# Patient Record
Sex: Female | Born: 1955 | Race: Black or African American | Hispanic: No | Marital: Single | Smoking: Never smoker
Health system: Southern US, Community
[De-identification: ages and names within clinical notes are randomized; demographics above are authoritative.]

## PROBLEM LIST (undated history)

## (undated) DIAGNOSIS — T783XXA Angioneurotic edema, initial encounter: Secondary | ICD-10-CM

## (undated) HISTORY — DX: Angioneurotic edema, initial encounter: T78.3XXA

---

## 1999-02-20 ENCOUNTER — Ambulatory Visit (HOSPITAL_COMMUNITY): Admission: RE | Admit: 1999-02-20 | Discharge: 1999-02-20 | Payer: Self-pay | Admitting: *Deleted

## 1999-02-20 ENCOUNTER — Encounter: Payer: Self-pay | Admitting: *Deleted

## 2009-01-03 ENCOUNTER — Ambulatory Visit: Payer: Self-pay | Admitting: Diagnostic Radiology

## 2009-01-03 ENCOUNTER — Emergency Department (HOSPITAL_BASED_OUTPATIENT_CLINIC_OR_DEPARTMENT_OTHER): Admission: EM | Admit: 2009-01-03 | Discharge: 2009-01-04 | Payer: Self-pay | Admitting: Emergency Medicine

## 2010-07-09 LAB — COMPREHENSIVE METABOLIC PANEL
ALT: 11 U/L (ref 0–35)
AST: 25 U/L (ref 0–37)
Albumin: 4.4 g/dL (ref 3.5–5.2)
Alkaline Phosphatase: 102 U/L (ref 39–117)
BUN: 7 mg/dL (ref 6–23)
CO2: 32 mEq/L (ref 19–32)
Calcium: 10.1 mg/dL (ref 8.4–10.5)
Chloride: 99 mEq/L (ref 96–112)
Creatinine, Ser: 0.8 mg/dL (ref 0.4–1.2)
GFR calc Af Amer: >60 mL/min (ref >60)
GFR calc non Af Amer: >60 mL/min (ref >60)
Glucose, Bld: 88 mg/dL (ref 70–99)
Potassium: 3.8 mEq/L (ref 3.5–5.1)
Sodium: 141 mEq/L (ref 135–145)
Total Bilirubin: 0.2 mg/dL — ABNORMAL LOW (ref 0.3–1.2)
Total Protein: 8.7 g/dL — ABNORMAL HIGH (ref 6.0–8.3)

## 2010-07-09 LAB — DIFFERENTIAL
Basophils Relative: 1 % (ref 0–1)
Eosinophils Absolute: 0.1 10*3/uL (ref 0.0–0.7)
Eosinophils Relative: 1 % (ref 0–5)
Lymphs Abs: 1.6 10*3/uL (ref 0.7–4.0)
Monocytes Absolute: 0.3 10*3/uL (ref 0.1–1.0)
Monocytes Relative: 5 % (ref 3–12)
Neutrophils Relative %: 67 % (ref 43–77)

## 2010-07-09 LAB — POCT CARDIAC MARKERS
CKMB, poc: <1 ng/mL — ABNORMAL LOW (ref 1.0–8.0)
Myoglobin, poc: 47.2 ng/mL (ref 12–200)
Myoglobin, poc: 93.4 ng/mL (ref 12–200)

## 2010-07-09 LAB — CBC
HCT: 44.1 % (ref 36.0–46.0)
Hemoglobin: 14.6 g/dL (ref 12.0–15.0)
MCHC: 33.1 g/dL (ref 30.0–36.0)
MCV: 80.4 fL (ref 78.0–100.0)
Platelets: 245 10*3/uL (ref 150–400)
RBC: 5.49 MIL/uL — ABNORMAL HIGH (ref 3.87–5.11)
RDW: 15 % (ref 11.5–15.5)
WBC: 6 10*3/uL (ref 4.0–10.5)

## 2010-07-09 LAB — URINALYSIS, ROUTINE W REFLEX MICROSCOPIC
Bilirubin Urine: NEGATIVE
Glucose, UA: NEGATIVE mg/dL
Ketones, ur: NEGATIVE mg/dL
Nitrite: NEGATIVE
Specific Gravity, Urine: 1.002 — ABNORMAL LOW (ref 1.005–1.030)
pH: 5.5 (ref 5.0–8.0)

## 2010-07-09 LAB — LACTIC ACID, PLASMA: Lactic Acid, Venous: 1.2 mmol/L (ref 0.5–2.2)

## 2010-07-09 LAB — URINE MICROSCOPIC-ADD ON

## 2010-07-09 LAB — PREGNANCY, URINE: Preg Test, Ur: NEGATIVE

## 2010-07-24 IMAGING — CT CT ABDOMEN W/ CM
2 of 5 series · 16 of 46 positions shown, 18 images · IV contrast (APPLIED)
Comparison: None

CT ABDOMEN

CLINICAL DATA: Abdominal and pelvic pain.  Cramping.  Recent
colonoscopy and endoscopy yesterday.

CT ABDOMEN AND PELVIS WITH CONTRAST
TECHNIQUE: Multidetector CT imaging of the abdomen and pelvis was
performed using the standard protocol following bolus
administration of intravenous contrast.
Contrast: 100 ml Qmnipaque-2NN and oral contrast

[Series 2: abd/pelvis 5.0 b31f · axial · 0.66mm/px · z∈[-432,-32]mm · 13 of 92 slices shown, 15 images]
[im 6/92  soft-tissue]
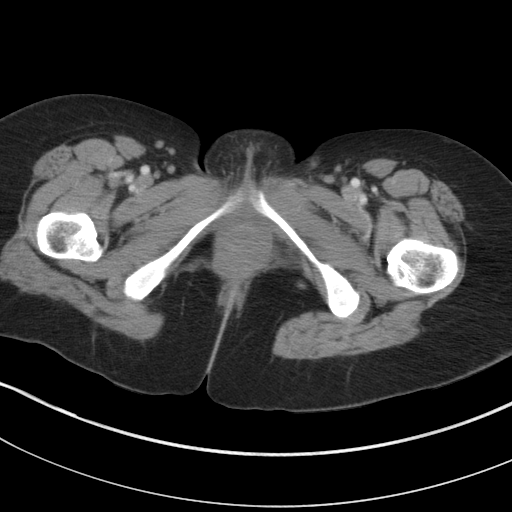
[im 6/92  bone]
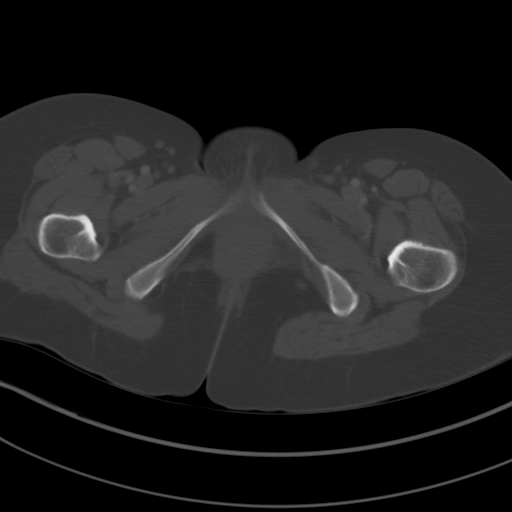
[im 11/92  soft-tissue]
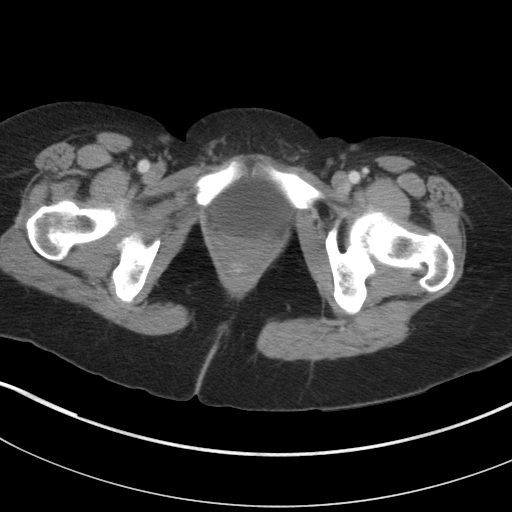
[im 21/92  soft-tissue]
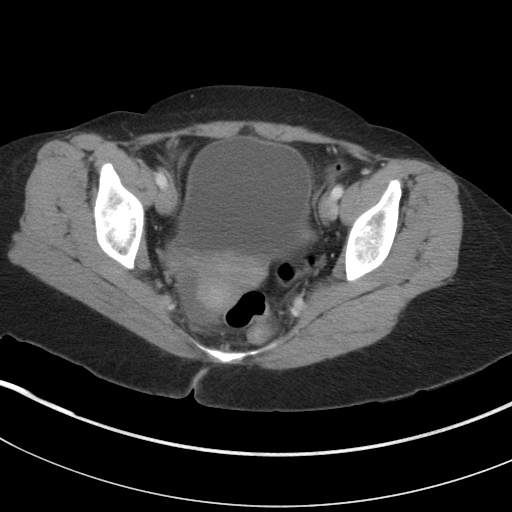
[im 26/92  soft-tissue]
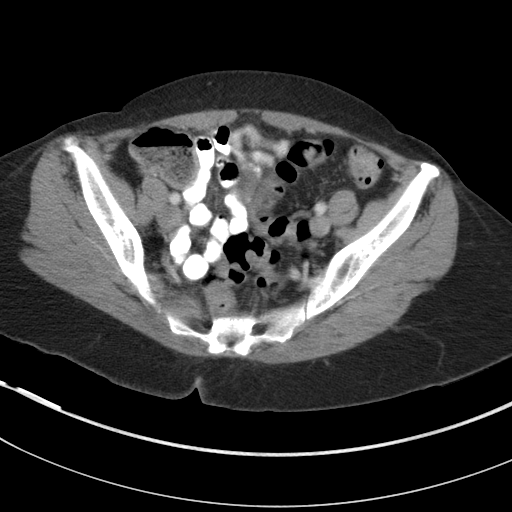
[im 31/92  soft-tissue]
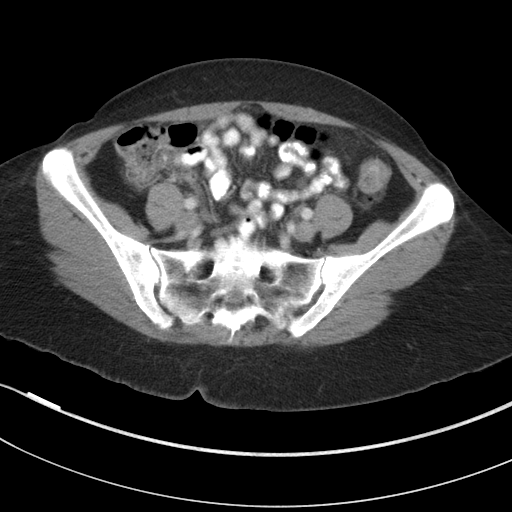
[im 41/92  soft-tissue]
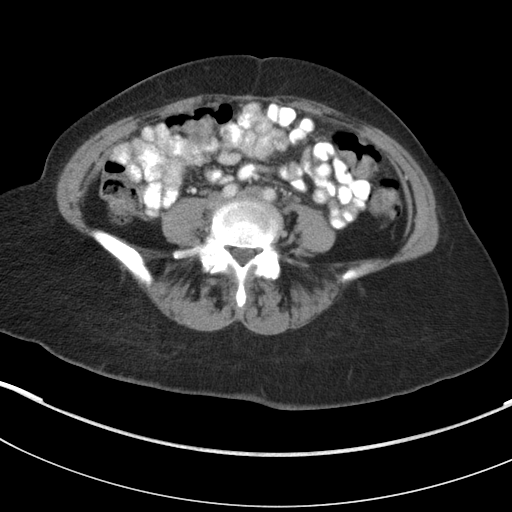
[im 46/92  soft-tissue]
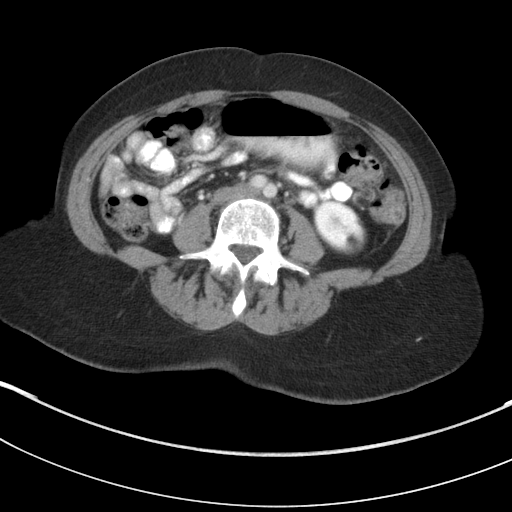
[im 51/92  soft-tissue]
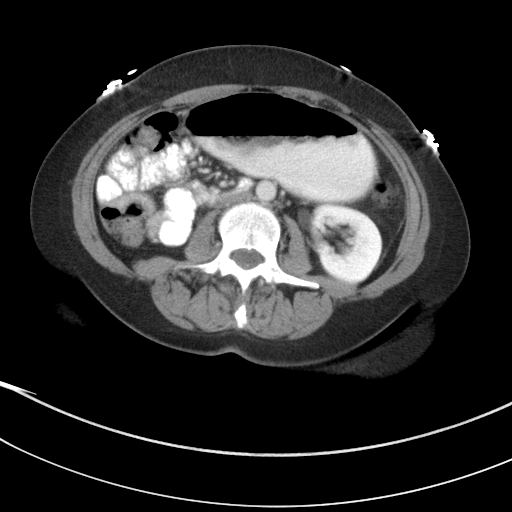
[im 61/92  soft-tissue]
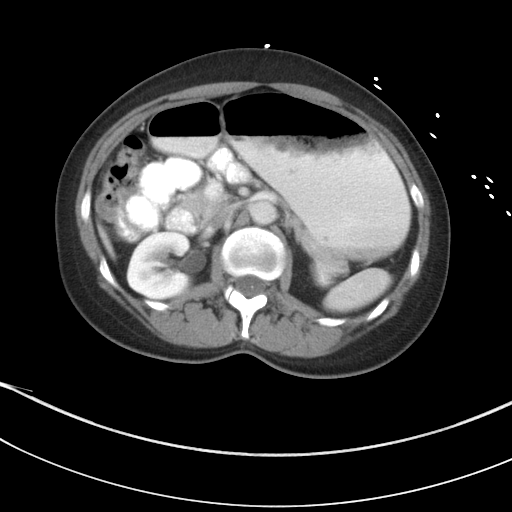
[im 61/92  bone]
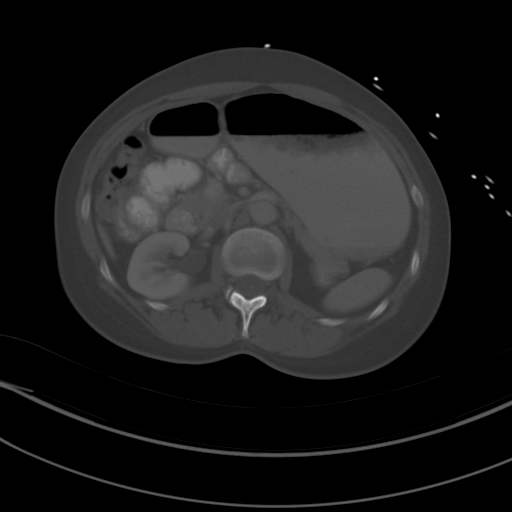
[im 66/92  soft-tissue]
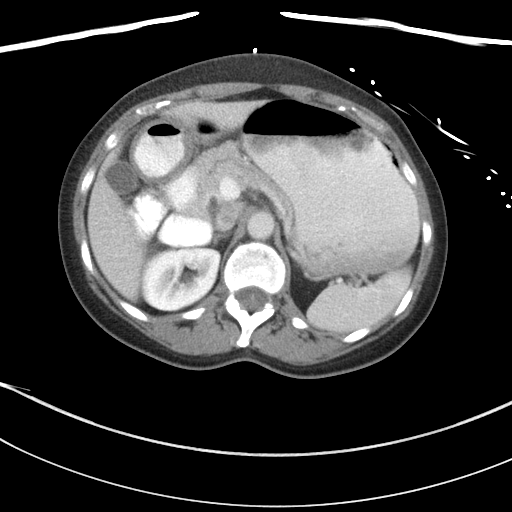
[im 71/92  soft-tissue]
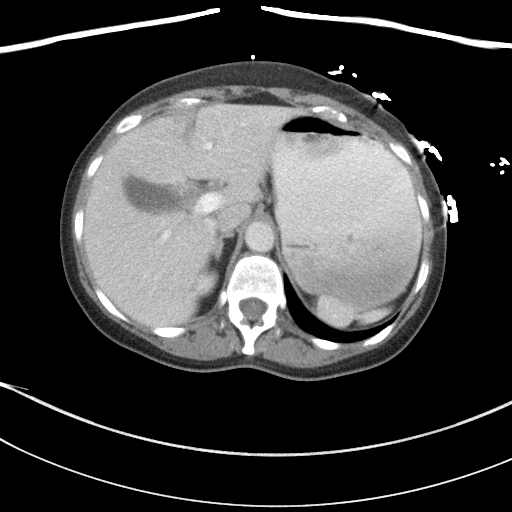
[im 81/92  soft-tissue]
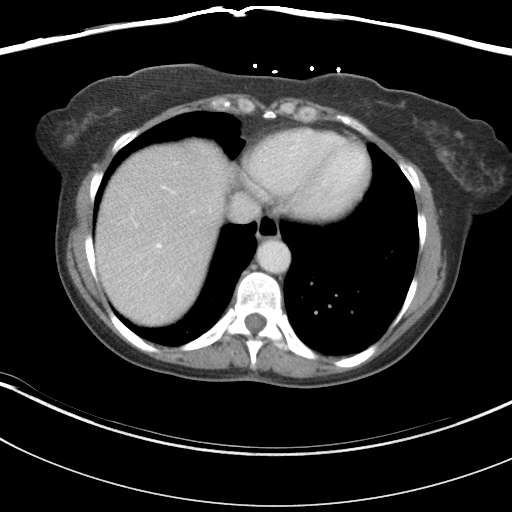
[im 86/92  soft-tissue]
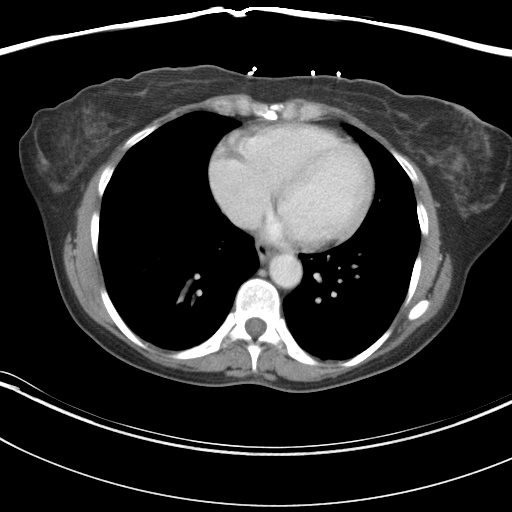

[Series 5: abd/pelvis 3.0 coronal · coronal · 0.74mm/px · 3 of 76 slices shown]
[im 26/76  soft-tissue]
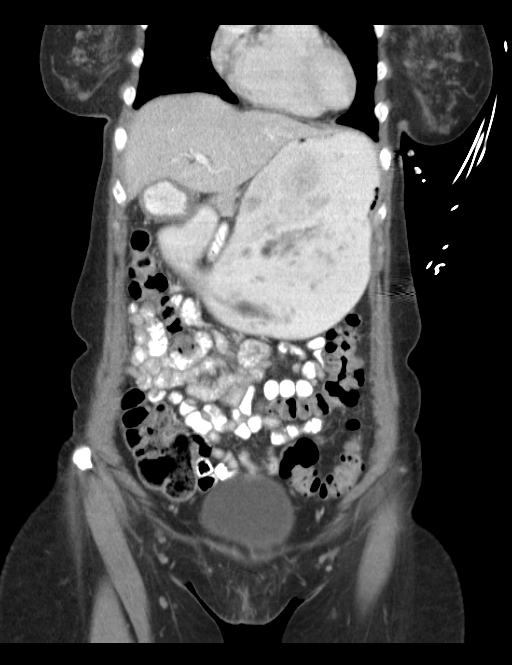
[im 34/76  soft-tissue]
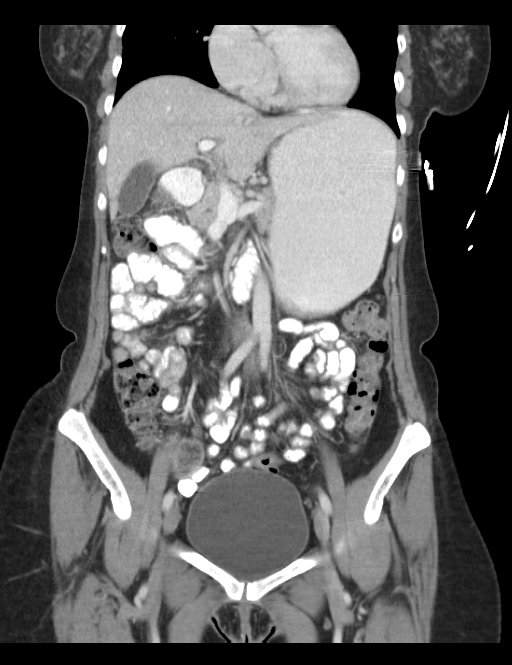
[im 42/76  soft-tissue]
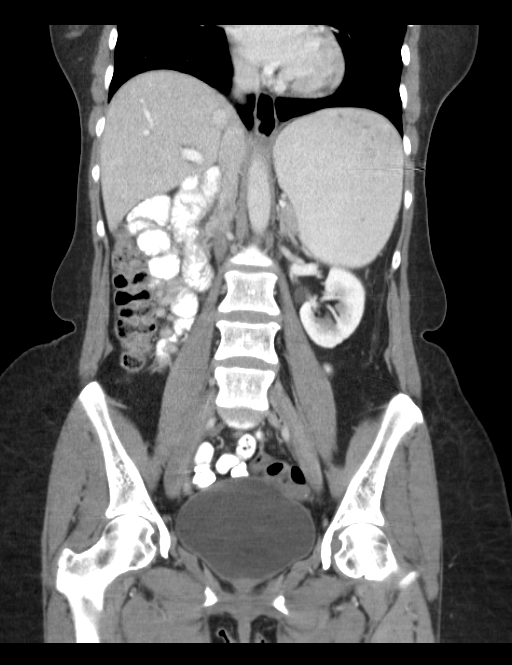

[16 of 46 positions shown; findings below may reference images not displayed]

FINDINGS: The abdominal parenchymal organs are normal in
appearance.  Gallbladder is unremarkable, and there is no evidence
of hydronephrosis.  There is no evidence of mass or adenopathy.
There is no evidence of inflammatory process or abnormal fluid
collections.

Congenital malrotation of bowel is seen, with proximal jejunum in
the right upper quadrant of the the abdomen.  Cecum appears normal
position in the right lower quadrant.  There is no evidence of
dilated bowel loops of bowel wall thickening.
IMPRESSION: 1.  No acute intra-abdominal findings.
2.  Congenital malrotation of bowel incidentally noted.

CT PELVIS
FINDINGS: A small amount of free fluid is seen in the pelvic cul-
de-sac.  This is nonspecific finding and may be physiologic in a
menstrual age female.  There is no evidence of inflammatory process
or abscess.  There is no evidence of pelvic soft tissue masses or
lymphadenopathy.  There is no evidence of dilated bowel loops.
IMPRESSION: Small amount of free fluid in the pelvis, which is nonspecific but
may be physiologic in a menstrual age female.  Otherwise
unremarkable pelvis CT.

## 2014-01-01 DIAGNOSIS — R202 Paresthesia of skin: Secondary | ICD-10-CM | POA: Insufficient documentation

## 2014-01-01 DIAGNOSIS — W868XXA Exposure to other electric current, initial encounter: Secondary | ICD-10-CM | POA: Insufficient documentation

## 2014-01-01 DIAGNOSIS — G43909 Migraine, unspecified, not intractable, without status migrainosus: Secondary | ICD-10-CM | POA: Insufficient documentation

## 2014-01-01 DIAGNOSIS — R142 Eructation: Secondary | ICD-10-CM | POA: Insufficient documentation

## 2014-01-01 DIAGNOSIS — Z9889 Other specified postprocedural states: Secondary | ICD-10-CM | POA: Insufficient documentation

## 2014-01-01 DIAGNOSIS — M8580 Other specified disorders of bone density and structure, unspecified site: Secondary | ICD-10-CM | POA: Insufficient documentation

## 2014-01-01 DIAGNOSIS — N63 Unspecified lump in unspecified breast: Secondary | ICD-10-CM | POA: Insufficient documentation

## 2016-05-07 DIAGNOSIS — H04123 Dry eye syndrome of bilateral lacrimal glands: Secondary | ICD-10-CM | POA: Diagnosis not present

## 2016-05-07 DIAGNOSIS — H40023 Open angle with borderline findings, high risk, bilateral: Secondary | ICD-10-CM | POA: Diagnosis not present

## 2016-06-03 DIAGNOSIS — R197 Diarrhea, unspecified: Secondary | ICD-10-CM | POA: Diagnosis not present

## 2016-06-09 DIAGNOSIS — R197 Diarrhea, unspecified: Secondary | ICD-10-CM | POA: Diagnosis not present

## 2016-06-09 DIAGNOSIS — R109 Unspecified abdominal pain: Secondary | ICD-10-CM | POA: Diagnosis not present

## 2016-06-10 DIAGNOSIS — R197 Diarrhea, unspecified: Secondary | ICD-10-CM | POA: Diagnosis not present

## 2016-07-06 DIAGNOSIS — H5213 Myopia, bilateral: Secondary | ICD-10-CM | POA: Diagnosis not present

## 2016-07-06 DIAGNOSIS — H40023 Open angle with borderline findings, high risk, bilateral: Secondary | ICD-10-CM | POA: Diagnosis not present

## 2016-07-06 DIAGNOSIS — H04123 Dry eye syndrome of bilateral lacrimal glands: Secondary | ICD-10-CM | POA: Diagnosis not present

## 2016-09-23 DIAGNOSIS — R05 Cough: Secondary | ICD-10-CM | POA: Diagnosis not present

## 2016-10-30 DIAGNOSIS — M25512 Pain in left shoulder: Secondary | ICD-10-CM | POA: Diagnosis not present

## 2016-10-30 DIAGNOSIS — M62838 Other muscle spasm: Secondary | ICD-10-CM | POA: Diagnosis not present

## 2016-12-07 DIAGNOSIS — Z1231 Encounter for screening mammogram for malignant neoplasm of breast: Secondary | ICD-10-CM | POA: Diagnosis not present

## 2016-12-10 DIAGNOSIS — R928 Other abnormal and inconclusive findings on diagnostic imaging of breast: Secondary | ICD-10-CM | POA: Diagnosis not present

## 2016-12-10 DIAGNOSIS — N6002 Solitary cyst of left breast: Secondary | ICD-10-CM | POA: Diagnosis not present

## 2016-12-10 DIAGNOSIS — N6320 Unspecified lump in the left breast, unspecified quadrant: Secondary | ICD-10-CM | POA: Diagnosis not present

## 2016-12-13 DIAGNOSIS — M8588 Other specified disorders of bone density and structure, other site: Secondary | ICD-10-CM | POA: Diagnosis not present

## 2017-01-03 DIAGNOSIS — Z23 Encounter for immunization: Secondary | ICD-10-CM | POA: Diagnosis not present

## 2017-01-03 DIAGNOSIS — Z0000 Encounter for general adult medical examination without abnormal findings: Secondary | ICD-10-CM | POA: Diagnosis not present

## 2017-01-03 DIAGNOSIS — Z136 Encounter for screening for cardiovascular disorders: Secondary | ICD-10-CM | POA: Diagnosis not present

## 2017-01-04 DIAGNOSIS — M1711 Unilateral primary osteoarthritis, right knee: Secondary | ICD-10-CM | POA: Diagnosis not present

## 2017-01-04 DIAGNOSIS — M170 Bilateral primary osteoarthritis of knee: Secondary | ICD-10-CM | POA: Diagnosis not present

## 2017-01-04 DIAGNOSIS — M1712 Unilateral primary osteoarthritis, left knee: Secondary | ICD-10-CM | POA: Diagnosis not present

## 2017-02-28 DIAGNOSIS — Z01419 Encounter for gynecological examination (general) (routine) without abnormal findings: Secondary | ICD-10-CM | POA: Diagnosis not present

## 2017-04-05 HISTORY — PX: BREAST LUMPECTOMY: SHX2

## 2017-05-06 DIAGNOSIS — H04123 Dry eye syndrome of bilateral lacrimal glands: Secondary | ICD-10-CM | POA: Diagnosis not present

## 2017-05-06 DIAGNOSIS — H01009 Unspecified blepharitis unspecified eye, unspecified eyelid: Secondary | ICD-10-CM | POA: Diagnosis not present

## 2017-06-14 DIAGNOSIS — Z09 Encounter for follow-up examination after completed treatment for conditions other than malignant neoplasm: Secondary | ICD-10-CM | POA: Diagnosis not present

## 2017-06-14 DIAGNOSIS — N6002 Solitary cyst of left breast: Secondary | ICD-10-CM | POA: Diagnosis not present

## 2017-07-04 DIAGNOSIS — S9031XA Contusion of right foot, initial encounter: Secondary | ICD-10-CM | POA: Diagnosis not present

## 2017-07-12 DIAGNOSIS — H04123 Dry eye syndrome of bilateral lacrimal glands: Secondary | ICD-10-CM | POA: Diagnosis not present

## 2017-07-12 DIAGNOSIS — H40023 Open angle with borderline findings, high risk, bilateral: Secondary | ICD-10-CM | POA: Diagnosis not present

## 2017-07-12 DIAGNOSIS — H01009 Unspecified blepharitis unspecified eye, unspecified eyelid: Secondary | ICD-10-CM | POA: Diagnosis not present

## 2017-11-16 DIAGNOSIS — M170 Bilateral primary osteoarthritis of knee: Secondary | ICD-10-CM | POA: Diagnosis not present

## 2017-11-16 DIAGNOSIS — M25562 Pain in left knee: Secondary | ICD-10-CM | POA: Diagnosis not present

## 2017-11-16 DIAGNOSIS — M25561 Pain in right knee: Secondary | ICD-10-CM | POA: Diagnosis not present

## 2017-12-13 DIAGNOSIS — Z09 Encounter for follow-up examination after completed treatment for conditions other than malignant neoplasm: Secondary | ICD-10-CM | POA: Diagnosis not present

## 2017-12-13 DIAGNOSIS — R922 Inconclusive mammogram: Secondary | ICD-10-CM | POA: Diagnosis not present

## 2017-12-13 DIAGNOSIS — N6002 Solitary cyst of left breast: Secondary | ICD-10-CM | POA: Diagnosis not present

## 2017-12-15 ENCOUNTER — Other Ambulatory Visit: Payer: Self-pay | Admitting: Obstetrics and Gynecology

## 2017-12-15 DIAGNOSIS — N6489 Other specified disorders of breast: Secondary | ICD-10-CM

## 2017-12-20 ENCOUNTER — Ambulatory Visit
Admission: RE | Admit: 2017-12-20 | Discharge: 2017-12-20 | Disposition: A | Discharge status: Home / Self Care | Payer: 59 | Source: Ambulatory Visit | Attending: Obstetrics and Gynecology | Admitting: Obstetrics and Gynecology

## 2017-12-20 DIAGNOSIS — N6489 Other specified disorders of breast: Secondary | ICD-10-CM

## 2017-12-20 DIAGNOSIS — N6092 Unspecified benign mammary dysplasia of left breast: Secondary | ICD-10-CM | POA: Diagnosis not present

## 2017-12-20 DIAGNOSIS — N6321 Unspecified lump in the left breast, upper outer quadrant: Secondary | ICD-10-CM | POA: Diagnosis not present

## 2018-01-05 DIAGNOSIS — Z23 Encounter for immunization: Secondary | ICD-10-CM | POA: Diagnosis not present

## 2018-01-11 DIAGNOSIS — R928 Other abnormal and inconclusive findings on diagnostic imaging of breast: Secondary | ICD-10-CM | POA: Diagnosis not present

## 2018-01-17 DIAGNOSIS — G43909 Migraine, unspecified, not intractable, without status migrainosus: Secondary | ICD-10-CM | POA: Diagnosis not present

## 2018-01-17 DIAGNOSIS — E663 Overweight: Secondary | ICD-10-CM | POA: Diagnosis not present

## 2018-01-17 DIAGNOSIS — Z23 Encounter for immunization: Secondary | ICD-10-CM | POA: Diagnosis not present

## 2018-01-17 DIAGNOSIS — Z13220 Encounter for screening for lipoid disorders: Secondary | ICD-10-CM | POA: Diagnosis not present

## 2018-01-17 DIAGNOSIS — Z0000 Encounter for general adult medical examination without abnormal findings: Secondary | ICD-10-CM | POA: Diagnosis not present

## 2018-01-24 DIAGNOSIS — R928 Other abnormal and inconclusive findings on diagnostic imaging of breast: Secondary | ICD-10-CM | POA: Diagnosis not present

## 2018-02-02 DIAGNOSIS — R928 Other abnormal and inconclusive findings on diagnostic imaging of breast: Secondary | ICD-10-CM | POA: Diagnosis not present

## 2018-02-02 DIAGNOSIS — N6012 Diffuse cystic mastopathy of left breast: Secondary | ICD-10-CM | POA: Diagnosis not present

## 2018-02-02 DIAGNOSIS — N62 Hypertrophy of breast: Secondary | ICD-10-CM | POA: Diagnosis not present

## 2018-02-02 DIAGNOSIS — N6489 Other specified disorders of breast: Secondary | ICD-10-CM | POA: Diagnosis not present

## 2018-03-19 DIAGNOSIS — N61 Mastitis without abscess: Secondary | ICD-10-CM | POA: Insufficient documentation

## 2018-03-20 DIAGNOSIS — Z01419 Encounter for gynecological examination (general) (routine) without abnormal findings: Secondary | ICD-10-CM | POA: Diagnosis not present

## 2018-06-23 DIAGNOSIS — T783XXA Angioneurotic edema, initial encounter: Secondary | ICD-10-CM | POA: Diagnosis not present

## 2018-07-10 DIAGNOSIS — R21 Rash and other nonspecific skin eruption: Secondary | ICD-10-CM | POA: Diagnosis not present

## 2018-07-12 ENCOUNTER — Ambulatory Visit: Payer: Self-pay | Admitting: Allergy

## 2018-07-18 ENCOUNTER — Encounter: Payer: Self-pay | Admitting: Allergy

## 2018-07-18 ENCOUNTER — Other Ambulatory Visit: Payer: Self-pay

## 2018-07-18 ENCOUNTER — Ambulatory Visit (INDEPENDENT_AMBULATORY_CARE_PROVIDER_SITE_OTHER): Payer: 59 | Admitting: Allergy

## 2018-07-18 VITALS — BP 100/70 | HR 82 | Temp 97.8°F | Resp 16 | Ht 62.21 in | Wt 148.0 lb

## 2018-07-18 DIAGNOSIS — J3089 Other allergic rhinitis: Secondary | ICD-10-CM | POA: Diagnosis not present

## 2018-07-18 DIAGNOSIS — T783XXA Angioneurotic edema, initial encounter: Secondary | ICD-10-CM | POA: Insufficient documentation

## 2018-07-18 DIAGNOSIS — Z91030 Bee allergy status: Secondary | ICD-10-CM | POA: Diagnosis not present

## 2018-07-18 DIAGNOSIS — T783XXD Angioneurotic edema, subsequent encounter: Secondary | ICD-10-CM

## 2018-07-18 NOTE — Patient Instructions (Addendum)
Today's skin testing showed: positive to dust mites and cockroaches.  This does not explain your symptoms. Get bloodwork - CBC diff, CMP, ESR, CRP, Thyroid cascade, ANA w/reflex, C3, C4, SPEP and UPEP, tangerine IgE  . Avoid the following potential triggers: alcohol, tight clothing, NSAIDs.    For mild symptoms you can take over the counter antihistamines such as Benadryl and monitor symptoms closely. If symptoms worsen or if you have severe symptoms including breathing issues, throat closure, significant swelling, whole body hives, severe diarrhea and vomiting, lightheadedness then inject epinephrine and seek immediate medical care afterwards.   Take pictures if this happens again.    May use over the counter antihistamines such as Zyrtec (cetirizine), Claritin (loratadine), Allegra (fexofenadine), or Xyzal (levocetirizine) daily as needed.  Follow up in 1 month  Control of House Dust Mite Allergen . Dust mite allergens are a common trigger of allergy and asthma symptoms. While they can be found throughout the house, these microscopic creatures thrive in warm, humid environments such as bedding, upholstered furniture and carpeting. . Because so much time is spent in the bedroom, it is essential to reduce mite levels there.  . Encase pillows, mattresses, and box springs in special allergen-proof fabric covers or airtight, zippered plastic covers.  . Bedding should be washed weekly in hot water (130 F) and dried in a hot dryer. Allergen-proof covers are available for comforters and pillows that can't be regularly washed.  Wendee Copp the allergy-proof covers every few months. Minimize clutter in the bedroom. Keep pets out of the bedroom.  Marland Kitchen Keep humidity less than 50% by using a dehumidifier or air conditioning. You can buy a humidity measuring device called a hygrometer to monitor this.  . If possible, replace carpets with hardwood, linoleum, or washable area rugs. If that's not possible, vacuum  frequently with a vacuum that has a HEPA filter. . Remove all upholstered furniture and non-washable window drapes from the bedroom. . Remove all non-washable stuffed toys from the bedroom.  Wash stuffed toys weekly. Cockroach Allergen Avoidance Cockroaches are often found in the homes of densely populated urban areas, schools or commercial buildings, but these creatures can lurk almost anywhere. This does not mean that you have a dirty house or living area. . Block all areas where roaches can enter the home. This includes crevices, wall cracks and windows.  . Cockroaches need water to survive, so fix and seal all leaky faucets and pipes. Have an exterminator go through the house when your family and pets are gone to eliminate any remaining roaches. Marland Kitchen Keep food in lidded containers and put pet food dishes away after your pets are done eating. Vacuum and sweep the floor after meals, and take out garbage and recyclables. Use lidded garbage containers in the kitchen. Wash dishes immediately after use and clean under stoves, refrigerators or toasters where crumbs can accumulate. Wipe off the stove and other kitchen surfaces and cupboards regularly.

## 2018-07-18 NOTE — Assessment & Plan Note (Signed)
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 Takes OTC antihistamines with good benefit.  Today's skin testing was only positive to dust mites and cockroaches. Discussed environmental control measures.  May use over the counter antihistamines such as Zyrtec (cetirizine), Claritin (loratadine), Allegra (fexofenadine), or Xyzal (levocetirizine) daily as needed.

## 2018-07-18 NOTE — Progress Notes (Signed)
New Patient Note  RE: Pamela Swanson MRN: [id number] DOB: 20-Jan-1956 Date of Office Visit: 07/18/2018  Referring provider: Renford Dills, MD Primary care provider: Renford Dills, MD  Chief Complaint: Angioedema  History of Present Illness: I had the pleasure of seeing Pamela Swanson for initial evaluation at the [facility] on 07/18/2018. She is a 63 y.o. female, who is referred here by Renford Dills, MD for the evaluation of angioedema.    About 1 month ago patient went to bed around 10PM and about 30 minutes afterwards she noticed left sided cheek swelling. Denies any other symptoms or respiratory issues. She called her on call PCP who advised her to take some benadryl and symptoms resolved within a few hours.   She had dinner around 4-5PM and she is not sure what exactly she had but believes she had a veggie burger which she had on multiple occasions with no issues. There was also concern about tangerine as she usually does not eat citrus fruits as they trigger her migraines but has been eating tangerines more often lately.    1 week later the same thing happened around 10PM but this time her tongue swelled up and it was so enlarged she had difficulty speaking. She called her on call PCP again and was told to take some benadryl which helped but this time it took about 2 days for it to completely resolve. Denies any other associated symptoms. She is not sure what she ate this day.   Denies any pruritus but maybe she had little bumps on her arm.  She went to see the PCP the following day and she was referred to [place].   Patient has been a strict vegetarian for 30 years. No previous angioedema episodes. Denies any fevers, chills, changes in medications, personal care products or recent infections.  Previous work up includes: none. Patient is up to date with the following cancer screening tests: mammogram, colonoscopy, pap smears. No history of cancer.  Assessment  and Plan: [name] is a 63 y.o. female with: Angio-edema 2 episodes of angioedema on involving cheek and another the tongue 1 week apart. No triggers noted. No other symptoms. Denies changes in medications, personal care products, recent infections. She has been eating more tangerines which is new. Took benadryl and symptoms resolved within 1-2 days. No prior history of angioedema.  Today's skin testing showed: positive to dust mites and cockroaches. Negative to select foods.  Discussed with patient that this does not explain her symptoms and at this point not sure what triggered these angioedema episodes.   Avoid citrus fruits for now.   Will get some bloodwork as below to rule out any other etiologies.  . Avoid the following potential triggers: alcohol, tight clothing, NSAIDs.   Take pictures if this happens again.   For mild symptoms you can take over the counter antihistamines such as Benadryl and monitor symptoms closely. If symptoms worsen or if you have severe symptoms including breathing issues, throat closure, significant swelling, whole body hives, severe diarrhea and vomiting, lightheadedness then inject epinephrine and seek immediate medical care afterwards.  Anaphylaxis action plan given.   Other allergic rhinitis Rhino conjunctivitis symptoms for the past 50+ years mainly in the spring. Takes OTC antihistamines with good benefit.  Today's skin testing was only positive to dust mites and cockroaches. Discussed environmental control measures.  May use over the counter antihistamines such as Zyrtec (cetirizine), Claritin (loratadine), Allegra (fexofenadine), or Xyzal (levocetirizine) daily as  needed.  H/O bee sting allergy History of reaction as a child and has Epipen due to this. No previous work up.  Get tryptase and hymenoptera panel.  Continue to avoid.  For mild symptoms you can take over the counter antihistamines such as Benadryl and monitor symptoms closely. If  symptoms worsen or if you have severe symptoms including breathing issues, throat closure, significant swelling, whole body hives, severe diarrhea and vomiting, lightheadedness then inject epinephrine and seek immediate medical care afterwards.  Return in about 4 weeks (around 08/15/2018).  Lab Orders     CBC with Differential/Platelet     Comprehensive metabolic panel     Sedimentation rate     Thyroid Cascade Profile     ANA w/Reflex     C3 and C4     Protein electrophoresis, serum     Protein Electrophoresis, Urine Rflx.     Tryptase     Allergen, Tangerine, [id number]     Allergen Hymenoptera Panel  Other allergy screening: Asthma: no Rhino conjunctivitis: yes She reports symptoms of itchy nose, rhinorrhea, itchy/watery eyes. Symptoms have been going on for 50+ years. The symptoms are present during the spring. She has used antihistamines with fair improvement in symptoms. Previous work up includes: none. Food allergy: no  Dietary History: patient has been eating other foods including milk, eggs, peanut, treenuts, sesame, soy, wheat, fruits and vegetables.  She reports reading labels and avoiding citrus fruits in diet completely.   Medication allergy: no Hymenoptera allergy: yes  Swelling - no reactions since a child. Has epipen on hand if needed. Urticaria: no Eczema:no History of recurrent infections suggestive of immunodeficency: no  Diagnostics: Skin Testing: Environmental allergy panel and select foods. Positive test to: dust mites and cockroach. Negative test to: foods.  Results discussed with patient/family. Pediatric Percutaneous Testing - 07/18/18 1028    Time Antigen Placed  1028    Allergen Manufacturer  Waynette Buttery    Location  Back    Number of Test  22    Pediatric Panel  Foods    3. Peanut  Negative    4. Soy bean food  Negative    5. Wheat, whole  Negative    6. Sesame  Negative    7. Milk, cow  Negative    8. Egg white, chicken  Negative    9. Casein   Negative    10. Cashew  Negative    11. Pecan   Negative    12. Walnut  Negative    13. Shellfish  Negative    15. Fish Mix  Negative    22. Rice  Negative    23. White Potato  Negative    24. Tomato  Negative    25. Orange  Negative    26. Banana  Negative    27. Apple  Negative    28. Peach  Negative    29. Potato, Sweet  Negative    30. Pea, Green/English  Negative    31. Corn   Negative     Airborne Adult Perc - 07/18/18 1028    Time Antigen Placed  1028    Allergen Manufacturer  Greer    Location  Back    Number of Test  59    Panel 1  Select    1. Control-Buffer 50% Glycerol  Negative    2. Control-Histamine 1 mg/ml  4+    3. Albumin saline  Negative    4. Bahia  Negative  5. French Southern Territories  Negative    6. Johnson  Negative    7. Kentucky Blue  Negative    8. Meadow Fescue  Negative    9. Perennial Rye  Negative    10. Sweet Vernal  Negative    11. Timothy  Negative    12. Cocklebur  Negative    13. Burweed Marshelder  Negative    14. Ragweed, short  Negative    15. Ragweed, Giant  Negative    16. Plantain,  English  Negative    17. Lamb's Quarters  Negative    18. Sheep Sorrell  Negative    19. Rough Pigweed  Negative    20. Marsh Elder, Rough  Negative    21. Mugwort, Common  Negative    22. Ash mix  Negative    23. Birch mix  Negative    24. Beech American  Negative    25. Box, Elder  Negative    26. Cedar, red  Negative    27. Cottonwood, Guinea-Bissau  Negative    28. Elm mix  Negative    29. Hickory mix  Negative    30. Maple mix  Negative    31. Oak, Guinea-Bissau mix  Negative    32. Pecan Pollen  Negative    33. Pine mix  Negative    34. Sycamore Eastern  Negative    35. Walnut, Black Pollen  Negative    36. Alternaria alternata  Negative    37. Cladosporium Herbarum  Negative    38. Aspergillus mix  Negative    39. Penicillium mix  Negative    40. Bipolaris sorokiniana (Helminthosporium)  Negative    41. Drechslera spicifera (Curvularia)  Negative    42.  Mucor plumbeus  Negative    43. Fusarium moniliforme  Negative    44. Aureobasidium pullulans (pullulara)  Negative    45. Rhizopus oryzae  Negative    46. Botrytis cinera  Negative    47. Epicoccum nigrum  Negative    48. Phoma betae  Negative    49. Candida Albicans  Negative    50. Trichophyton mentagrophytes  Negative    51. Mite, D Farinae  5,000 AU/ml  2+    52. Mite, D Pteronyssinus  5,000 AU/ml  --   +/-   53. Cat Hair 10,000 BAU/ml  Negative    54.  Dog Epithelia  Negative    55. Mixed Feathers  Negative    56. Horse Epithelia  Negative    57. Cockroach, German  2+    58. Mouse  Negative    59. Tobacco Leaf  Negative       Past Medical History: Patient Active Problem List   Diagnosis Date Noted  . Angio-edema 07/18/2018  . Other allergic rhinitis 07/18/2018  . H/O bee sting allergy 07/18/2018   Past Medical History:  Diagnosis Date  . Angio-edema    Past Surgical History: Past Surgical History:  Procedure Laterality Date  . BREAST LUMPECTOMY  2019  . CESAREAN SECTION  1991   Medication List:  Current Outpatient Medications  Medication Sig Dispense Refill  . Ascorbic Acid (VITAMIN C) 100 MG tablet Take by mouth.    Marland Kitchen augmented betamethasone dipropionate (DIPROLENE-AF) 0.05 % cream APP EXT AA QD FOR 14 DAYS    . Bacillus Coagulans-Inulin (PROBIOTIC-PREBIOTIC PO) Take by mouth daily.    . Cholecalciferol (VITAMIN D-3 PO) Take by mouth.    . Cyanocobalamin (VITAMIN B 12 PO) Take  by mouth daily.    . diphenhydrAMINE (SOMINEX) 25 MG tablet Take 25 mg by mouth at bedtime as needed for sleep.    Marland Kitchen. EPINEPHrine (EPIPEN 2-PAK) 0.3 mg/0.3 mL IJ SOAJ injection Inject 0.3 mg into the muscle as needed for anaphylaxis.    . Multiple Vitamin (MULTI-VITAMIN DAILY PO) as needed.    . TURMERIC PO Take by mouth.     No current facility-administered medications for this visit.    Allergies: Allergies  Allergen Reactions  . Bee Venom Anaphylaxis    Allergic to yellow  jackets   Social History: Social History   Socioeconomic History  . Marital status: Single    Spouse name: Not on file  . Number of children: Not on file  . Years of education: Not on file  . Highest education level: Not on file  Occupational History  . Not on file  Social Needs  . Financial resource strain: Not on file  . Food insecurity:    Worry: Not on file    Inability: Not on file  . Transportation needs:    Medical: Not on file    Non-medical: Not on file  Tobacco Use  . Smoking status: Never Smoker  . Smokeless tobacco: Never Used  Substance and Sexual Activity  . Alcohol use: Not on file  . Drug use: Not on file  . Sexual activity: Not on file  Lifestyle  . Physical activity:    Days per week: Not on file    Minutes per session: Not on file  . Stress: Not on file  Relationships  . Social connections:    Talks on phone: Not on file    Gets together: Not on file    Attends religious service: Not on file    Active member of club or organization: Not on file    Attends meetings of clubs or organizations: Not on file    Relationship status: Not on file  Other Topics Concern  . Not on file  Social History Narrative  . Not on file   Lives in a 61 year old home. Smoking: denies Occupation: retired  Landscape architectnvironmental HistorySurveyor, minerals: Water Damage/mildew in the house: no Engineer, civil (consulting)Carpet in the family room: yes Carpet in the bedroom: yes Heating: gas Cooling: central Pet: yes 1 dog x 10 yrs  Family History: Family History  Problem Relation Age of Onset  . Allergic rhinitis Son   . Asthma Son    Review of Systems  Constitutional: Negative for appetite change, chills, fever and unexpected weight change.  HENT: Positive for rhinorrhea. Negative for congestion.   Eyes: Positive for itching.  Respiratory: Negative for cough, chest tightness, shortness of breath and wheezing.   Cardiovascular: Negative for chest pain.  Gastrointestinal: Negative for abdominal pain.   Genitourinary: Negative for difficulty urinating.  Skin: Negative for rash.  Allergic/Immunologic: Positive for environmental allergies.  Neurological: Negative for headaches.   Objective: BP 100/70   Pulse 82   Temp 97.8 F (36.6 C) (Oral)   Resp 16   Ht 5' 2.21" (1.58 m)   Wt 148 lb (67.1 kg)   SpO2 95%   BMI 26.89 kg/m  Body mass index is 26.89 kg/m. Physical Exam  Constitutional: She is oriented to person, place, and time. She appears well-developed and well-nourished.  HENT:  Head: Normocephalic and atraumatic.  Right Ear: External ear normal.  Left Ear: External ear normal.  Nose: Nose normal.  Mouth/Throat: Oropharynx is clear and moist.  Eyes: Conjunctivae and  EOM are normal.  Neck: Neck supple.  Cardiovascular: Normal rate, regular rhythm and normal heart sounds. Exam reveals no gallop and no friction rub.  No murmur heard. Pulmonary/Chest: Effort normal and breath sounds normal. She has no wheezes. She has no rales.  Abdominal: Soft.  Neurological: She is alert and oriented to person, place, and time.  Skin: Skin is warm. No rash noted.  Psychiatric: She has a normal mood and affect. Her behavior is normal.  Nursing note and vitals reviewed.  The plan was reviewed with the patient/family, and all questions/concerned were addressed.  It was my pleasure to see Tauna today and participate in her care. Please feel free to contact me with any questions or concerns.  Sincerely,  Wyline Mood, DO Allergy & Immunology  [facility] office: [phone] [facility] office: [phone]

## 2018-07-18 NOTE — Assessment & Plan Note (Signed)
History of reaction as a child and has Epipen due to this. No previous work up.  Get tryptase and hymenoptera panel.  Continue to avoid.  For mild symptoms you can take over the counter antihistamines such as Benadryl and monitor symptoms closely. If symptoms worsen or if you have severe symptoms including breathing issues, throat closure, significant swelling, whole body hives, severe diarrhea and vomiting, lightheadedness then inject epinephrine and seek immediate medical care afterwards.

## 2018-07-18 NOTE — Assessment & Plan Note (Addendum)
2 episodes of angioedema on involving cheek and another the tongue 1 week apart. No triggers noted. No other symptoms. Denies changes in medications, personal care products, recent infections. She has been eating more tangerines which is new. Took benadryl and symptoms resolved within 1-2 days. No prior history of angioedema.  Today's skin testing showed: positive to dust mites and cockroaches. Negative to select foods.  Discussed with patient that this does not explain her symptoms and at this point not sure what triggered these angioedema episodes.   Avoid citrus fruits for now.   Will get some bloodwork as below to rule out any other etiologies.  . Avoid the following potential triggers: alcohol, tight clothing, NSAIDs.   Take pictures if this happens again.   For mild symptoms you can take over the counter antihistamines such as Benadryl and monitor symptoms closely. If symptoms worsen or if you have severe symptoms including breathing issues, throat closure, significant swelling, whole body hives, severe diarrhea and vomiting, lightheadedness then inject epinephrine and seek immediate medical care afterwards.  Anaphylaxis action plan given.

## 2018-07-20 ENCOUNTER — Other Ambulatory Visit: Payer: Self-pay

## 2018-07-20 DIAGNOSIS — T783XXD Angioneurotic edema, subsequent encounter: Secondary | ICD-10-CM

## 2018-07-20 LAB — COMPREHENSIVE METABOLIC PANEL
ALT: 17 IU/L (ref 0–32)
AST: 19 IU/L (ref 0–40)
Albumin/Globulin Ratio: 1.5 (ref 1.2–2.2)
Albumin: 4.6 g/dL (ref 3.8–4.8)
Alkaline Phosphatase: 139 IU/L — ABNORMAL HIGH (ref 39–117)
BUN/Creatinine Ratio: 11 — ABNORMAL LOW (ref 12–28)
BUN: 9 mg/dL (ref 8–27)
Bilirubin Total: 0.3 mg/dL (ref 0.0–1.2)
CO2: 22 mmol/L (ref 20–29)
Calcium: 9.7 mg/dL (ref 8.7–10.3)
Chloride: 103 mmol/L (ref 96–106)
Creatinine, Ser: 0.82 mg/dL (ref 0.57–1.00)
GFR calc Af Amer: 89 mL/min/{1.73_m2} (ref >59)
GFR calc non Af Amer: 77 mL/min/{1.73_m2} (ref >59)
Globulin, Total: 3 g/dL (ref 1.5–4.5)
Glucose: 83 mg/dL (ref 65–99)
Potassium: 4.4 mmol/L (ref 3.5–5.2)
Sodium: 142 mmol/L (ref 134–144)
Total Protein: 7.6 g/dL (ref 6.0–8.5)

## 2018-07-20 LAB — CBC WITH DIFFERENTIAL/PLATELET
Basophils Absolute: 0 10*3/uL (ref 0.0–0.2)
Basos: 1 %
EOS (ABSOLUTE): 0.1 10*3/uL (ref 0.0–0.4)
Eos: 1 %
Hematocrit: 43.5 % (ref 34.0–46.6)
Hemoglobin: 14.7 g/dL (ref 11.1–15.9)
Immature Grans (Abs): 0 10*3/uL (ref 0.0–0.1)
Immature Granulocytes: 0 %
Lymphocytes Absolute: 1.2 10*3/uL (ref 0.7–3.1)
Lymphs: 21 %
MCH: 26.4 pg — ABNORMAL LOW (ref 26.6–33.0)
MCHC: 33.8 g/dL (ref 31.5–35.7)
MCV: 78 fL — ABNORMAL LOW (ref 79–97)
Monocytes Absolute: 0.3 10*3/uL (ref 0.1–0.9)
Monocytes: 4 %
Neutrophils Absolute: 4.2 10*3/uL (ref 1.4–7.0)
Neutrophils: 73 %
Platelets: 250 10*3/uL (ref 150–450)
RBC: 5.57 x10E6/uL — ABNORMAL HIGH (ref 3.77–5.28)
RDW: 16.4 % — ABNORMAL HIGH (ref 11.7–15.4)
WBC: 5.7 10*3/uL (ref 3.4–10.8)

## 2018-07-20 LAB — PROTEIN ELECTROPHORESIS, SERUM
A/G Ratio: 1 (ref 0.7–1.7)
Albumin ELP: 3.8 g/dL (ref 2.9–4.4)
Alpha 1: 0.3 g/dL (ref 0.0–0.4)
Alpha 2: 1 g/dL (ref 0.4–1.0)
Beta: 1.1 g/dL (ref 0.7–1.3)
Gamma Globulin: 1.4 g/dL (ref 0.4–1.8)
Globulin, Total: 3.8 g/dL (ref 2.2–3.9)

## 2018-07-20 LAB — PROTEIN ELECTROPHORESIS, URINE REFLEX
Albumin ELP, Urine: 24.5 %
Alpha-1-Globulin, U: 7.1 %
Alpha-2-Globulin, U: 7.9 %
Beta Globulin, U: 48.8 %
Gamma Globulin, U: 11.8 %
Protein, Ur: 23.7 mg/dL

## 2018-07-20 LAB — ALLERGEN HYMENOPTERA PANEL
Bumblebee: <0.1 kU/L
Honeybee IgE: 0.38 kU/L — AB
Hornet, White Face, IgE: 0.25 kU/L — AB
Hornet, Yellow, IgE: 0.27 kU/L — AB
Paper Wasp IgE: 0.48 kU/L — AB
Yellow Jacket, IgE: 0.27 kU/L — AB

## 2018-07-20 LAB — C3 AND C4
Complement C3, Serum: 158 mg/dL (ref 82–167)
Complement C4, Serum: 31 mg/dL (ref 14–44)

## 2018-07-20 LAB — ANA W/REFLEX: Anti Nuclear Antibody (ANA): NEGATIVE

## 2018-07-20 LAB — SEDIMENTATION RATE: Sed Rate: 78 mm/hr — ABNORMAL HIGH (ref 0–40)

## 2018-07-20 LAB — ALLERGEN, TANGERINE, RF302: Tangerine IgE: <0.1 kU/L

## 2018-07-20 LAB — TRYPTASE: Tryptase: 86.9 ug/L — ABNORMAL HIGH (ref 2.2–13.2)

## 2018-07-20 LAB — THYROID CASCADE PROFILE: TSH: 0.929 u[IU]/mL (ref 0.450–4.500)

## 2018-07-21 ENCOUNTER — Telehealth: Payer: Self-pay | Admitting: *Deleted

## 2018-07-21 DIAGNOSIS — T783XXD Angioneurotic edema, subsequent encounter: Secondary | ICD-10-CM

## 2018-07-21 NOTE — Telephone Encounter (Signed)
Pt at lab to get repeat tryptase and the lab did not see order- it was put in as future as to why they couldn't see it and I it reordered it as normal.

## 2018-07-23 LAB — TRYPTASE: Tryptase: 6.8 ug/L (ref 2.2–13.2)

## 2018-07-24 ENCOUNTER — Ambulatory Visit (INDEPENDENT_AMBULATORY_CARE_PROVIDER_SITE_OTHER): Payer: 59 | Admitting: Family Medicine

## 2018-07-24 ENCOUNTER — Encounter: Payer: Self-pay | Admitting: Family Medicine

## 2018-07-24 ENCOUNTER — Other Ambulatory Visit: Payer: Self-pay

## 2018-07-24 ENCOUNTER — Telehealth: Payer: Self-pay

## 2018-07-24 DIAGNOSIS — J3089 Other allergic rhinitis: Secondary | ICD-10-CM

## 2018-07-24 DIAGNOSIS — T783XXD Angioneurotic edema, subsequent encounter: Secondary | ICD-10-CM | POA: Diagnosis not present

## 2018-07-24 DIAGNOSIS — Z91030 Bee allergy status: Secondary | ICD-10-CM | POA: Diagnosis not present

## 2018-07-24 MED ORDER — PREDNISONE 10 MG PO TABS: ORAL_TABLET | ORAL | 0 refills | Status: DC

## 2018-07-24 MED ORDER — FAMOTIDINE 20 MG PO TABS: 20.0000 mg | ORAL_TABLET | Freq: Two times a day (BID) | ORAL | 5 refills | Status: AC

## 2018-07-24 MED ORDER — CETIRIZINE HCL 10 MG PO TABS: 10.0000 mg | ORAL_TABLET | Freq: Two times a day (BID) | ORAL | 5 refills | Status: DC | PRN

## 2018-07-24 NOTE — Telephone Encounter (Signed)
Pt wants to do a virtual visit, so transferred her to [facility] b. To schedule that for her.

## 2018-07-24 NOTE — Patient Instructions (Addendum)
Angioedema, subsequent encounter Prednisone 10 mg tablets. Take 2 tablets twice a day for the next 3 days, then take 2 tablets once a day for 1 day, then take 1 tablet once on the 5th day Begin cetirizine 10 mg twice a day Begin famotidine 20 mg twice a day Repeat lab sedimentation rate in 1 month If your symptoms re-occur, begin a journal of events that occurred for up to 6 hours before your symptoms began including foods and beverages consumed, soaps or perfumes you had contact with, and medications.  In case of an allergic reaction, give Benadryl 50 mg every 4 hours, and if life-threatening symptoms occur, inject with EpiPen 0.3 mg.  H/O bee sting allergy Continue to avoid stinging insects. In case of an allergic reaction, give Benadryl 50 mg every 4 hours, and if life-threatening symptoms occur, inject with EpiPen 0.3 mg.  Allergic rhinitis Continue over the counter antihistamines as needed for a runnt nose Continue Flonase 1-2 sprays in each nostril once a day as needed for a stuffy nose  Call the clinic if this treatment plan is not working well for you  Continue the other medications as listed in the chart  Follow up in 2 months or sooner if needed

## 2018-07-24 NOTE — Telephone Encounter (Signed)
Pt woke up this am with left side of tongue swelling. Took 1 benadryl tablet and some liquid benadryl. She has only had a cup of coffee and some halls cough drops. Pt states it does not seem to be going done. Pt states it is not affecting her breathing its just affecting her talking and swallowing.  Please advise as what else she might be able to do. I did informed pt if she develops difficulty breathing in the mean time to call 911.   [phone]

## 2018-07-24 NOTE — Telephone Encounter (Signed)
Patient had coffee with creamer and hall cough drops made with honey in the morning and noticed that her tongue is swelling.  Patient had this cough drop before with no issues. No fevers/chills. She always takes a cough drop in the morning.  She also took the following: vitamin D, C, tumeric, joint relief vitamin, probiotic which are not new.  This happened about 1.5 hour ago. She took Benadryl 25mg  pill and 25mg  liquid and the swelling is the same. No respiratory issues. Patient was able to talk in complete sentences and sounded clear through the phone.   Patient does not want to get another blodwork drawn as she is a hard stick.  Also reviewed bloodwork results with patient.   She had black beans, rice, cole slaw for dinner - she has eaten all of these previously with no issues. She went to bed fine. 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   Advised patient to stop eating the hall cough drops. Take benadryl 25mg  every 4-6 hours for the next few days. Take pictures of the tongue.  If having issues with her breathing advised her to go to ER/urgent care immediately. Offered to get bloowork drawn to see if it's anaphylactic reaction and recheck tryptase but she had difficulty getting it drawn so prefers not to get bloodwork now.   Recommend she gets tryptase and C4 level drawn within 2-3 hours of next episode. See if she can come in for evaluation..may need prednisone.

## 2018-07-24 NOTE — Progress Notes (Addendum)
RE: Pamela Swanson C Meunier MRN: [id number] DOB: 11/01/1955 Date of Telemedicine Visit: 07/24/2018  Referring provider: Renford DillsPolite, Ronald, MD Primary care provider: Renford DillsPolite, Ronald, MD  Chief Complaint: Oral Swelling (tounge see telephone contact from this am)   Telemedicine Follow Up Visit via WebEx: I connected with Pamela Swanson Swanson for a follow up on 07/25/18 by WebEx and verified that I am speaking with the correct person using two identifiers.   I discussed the limitations, risks, security and privacy concerns of performing an evaluation and management service by telemedicine and the availability of in person appointments. I also discussed with the patient that there may be a patient responsible charge related to this service. The patient expressed understanding and agreed to proceed.  Patient is at home Provider is at the office.  Visit start time: 2:20 Visit end time: 3:01 Insurance consent/check in by: Jennette BankerJC [place] Medical consent and medical assistant/nurse: Rosana Fretarrie Whitaker  History of Present Illness: She is a 63 y.o. female, who is being followed for angioedema, allergic rhinitis, stinging insect allergy. Her previous allergy office visit was on 07/18/2018 with Dr. Selena BattenKim. At today's visit, she reports tongue swelling that began about 1 hour after waking up this morning. She reports at that time she had consumed coffee with almond creamer and a honey lemon cough drop. She tool benadryl 50 mg as well as 10 mg of Children's Benadryl with symptoms slowly beginning to resolve. She denies cardiopulmonary or gastrointestinal symptoms. Her current medications are listed in the chart.   Assessment and Plan: Pamela Swanson is a 63 y.o. female with:  Angioedema, subsequent encounter Prednisone 10 mg tablets. Take 2 tablets twice a day for the next 3 days, then take 2 tablets once a day for 1 day, then take 1 tablet once on the 5th day Begin cetirizine 10 mg twice a day Begin famotidine 20 mg twice a day Get  C4 and tryptase within hours of the next episode, [facility] Repeat lab sedimentation rate in 1 month If your symptoms re-occur, begin a journal of events that occurred for up to 6 hours before your symptoms began including foods and beverages consumed, soaps or perfumes you had contact with, and medications.  In case of an allergic reaction, give Benadryl 50 mg every 4 hours, and if life-threatening symptoms occur, inject with EpiPen 0.3 mg.  H/O bee sting allergy Continue to avoid stinging insects. In case of an allergic reaction, give Benadryl 50 mg every 4 hours, and if life-threatening symptoms occur, inject with EpiPen 0.3 mg.  Allergic rhinitis Continue over the counter antihistamines as needed for a runnt nose Continue Flonase 1-2 sprays in each nostril once a day as needed for a stuffy nose  Call the clinic if this treatment plan is not working well for you  Continue the other medications as listed in the chart  Follow up in 2 months or sooner if needed  Return in about 2 months (around 09/23/2018), or if symptoms worsen or fail to improve.  Meds ordered this encounter  Medications  . cetirizine (ZYRTEC) 10 MG tablet    Sig: Take 1 tablet (10 mg total) by mouth 2 (two) times daily as needed for allergies.    Dispense:  60 tablet    Refill:  5  . famotidine (PEPCID) 20 MG tablet    Sig: Take 1 tablet (20 mg total) by mouth 2 (two) times daily.    Dispense:  60 tablet    Refill:  5  .  predniSONE (DELTASONE) 10 MG tablet    Sig: Take 2 tablets twice a day for 3 days, then take 2 tablets on day 4, then 1 tablet on day 5    Dispense:  15 tablet    Refill:  0     Medication List:  Current Outpatient Medications  Medication Sig Dispense Refill  . Ascorbic Acid (VITAMIN C) 100 MG tablet Take by mouth.    . Bacillus Coagulans-Inulin (PROBIOTIC-PREBIOTIC PO) Take by mouth daily.    . Cholecalciferol (VITAMIN D-3 PO) Take by mouth.    . Cyanocobalamin  (VITAMIN B 12 PO) Take by mouth daily.    . diphenhydrAMINE (SOMINEX) 25 MG tablet Take 25 mg by mouth at bedtime as needed for sleep.    Marland Kitchen EPINEPHrine (EPIPEN 2-PAK) 0.3 mg/0.3 mL IJ SOAJ injection Inject 0.3 mg into the muscle as needed for anaphylaxis.    . Misc Natural Products (ADVANCED JOINT RELIEF PO) Take by mouth.    . TURMERIC PO Take by mouth.    . cetirizine (ZYRTEC) 10 MG tablet Take 1 tablet (10 mg total) by mouth 2 (two) times daily as needed for allergies. 60 tablet 5  . famotidine (PEPCID) 20 MG tablet Take 1 tablet (20 mg total) by mouth 2 (two) times daily. 60 tablet 5  . predniSONE (DELTASONE) 10 MG tablet Take 2 tablets twice a day for 3 days, then take 2 tablets on day 4, then 1 tablet on day 5 15 tablet 0   No current facility-administered medications for this visit.    Allergies: Allergies  Allergen Reactions  . Bee Venom Anaphylaxis    Allergic to yellow jackets   I reviewed her past medical history, social history, family history, and environmental history and no significant changes have been reported from previous visit on 07/18/2018.  Review of Systems  Constitutional: Negative for fever.  HENT: Negative.        Tongue swelling  Eyes: Negative.   Respiratory: Negative.   Cardiovascular: Negative.   Gastrointestinal: Negative.   Musculoskeletal: Negative.   Neurological: Negative.   Psychiatric/Behavioral: Negative.    Objective: Physical Exam  Constitutional: She is oriented to person, place, and time. She appears well-developed.  HENT:  Head: Normocephalic.  Tongue appears slightly swollen, although it is difficult to assess via webex  Eyes: Conjunctivae are normal.  Neck: Normal range of motion.  Musculoskeletal: Normal range of motion.  Neurological: She is alert and oriented to person, place, and time.  Psychiatric: She has a normal mood and affect. Her behavior is normal. Judgment and thought content normal.   Limited exam obtained as  encounter was done via WebEx.  Previous notes and tests were reviewed.  I discussed the assessment and treatment plan with the patient. The patient was provided an opportunity to ask questions and all were answered. The patient agreed with the plan and demonstrated an understanding of the instructions.   The patient was advised to call back or seek an in-person evaluation if the symptoms worsen or if the condition fails to improve as anticipated.  I provided 41 minutes of video-face-to-face time during this encounter.  It was my pleasure to participate in [place] Stai's care today. Please feel free to contact me with any questions or concerns.   Sincerely,  Thermon Leyland, FNP [facility]   I have provided oversight concerning Thermon Leyland' evaluation and treatment of this patient's health issues addressed during today's encounter.  I agree with the assessment and therapeutic plan as outlined in the note.   Thank you for the opportunity to care for this patient.  Please do not hesitate to contact me with questions.  Tonette Bihari, M.D.  [facility] [address] [place], [place] [address] [phone]

## 2018-07-25 ENCOUNTER — Telehealth: Payer: Self-pay

## 2018-07-25 NOTE — Telephone Encounter (Signed)
Ambs, Norvel Richards, FNP  [facility]        Can you please call this patient and have her get C4 and tryptase at [facility] within hours of the next time she has a swelling episode. Thank you    Informed pt of what anne stated she is feeling much better today.  Thurston Hole stated she has ordered the tryptase and the c4 already

## 2018-07-25 NOTE — Telephone Encounter (Signed)
Noted  

## 2018-08-18 ENCOUNTER — Ambulatory Visit: Payer: 59 | Admitting: Allergy

## 2018-09-29 ENCOUNTER — Ambulatory Visit: Payer: 59 | Admitting: Allergy

## 2018-12-07 ENCOUNTER — Other Ambulatory Visit: Payer: Self-pay | Admitting: Internal Medicine

## 2018-12-07 ENCOUNTER — Ambulatory Visit
Admission: RE | Admit: 2018-12-07 | Discharge: 2018-12-07 | Disposition: A | Discharge status: Home / Self Care | Payer: 59 | Source: Ambulatory Visit | Attending: Internal Medicine | Admitting: Internal Medicine

## 2018-12-07 DIAGNOSIS — R109 Unspecified abdominal pain: Secondary | ICD-10-CM

## 2018-12-07 DIAGNOSIS — R140 Abdominal distension (gaseous): Secondary | ICD-10-CM

## 2019-02-15 DIAGNOSIS — M25562 Pain in left knee: Secondary | ICD-10-CM | POA: Insufficient documentation

## 2019-02-15 DIAGNOSIS — M25561 Pain in right knee: Secondary | ICD-10-CM | POA: Insufficient documentation

## 2019-02-20 DIAGNOSIS — Z789 Other specified health status: Secondary | ICD-10-CM | POA: Insufficient documentation

## 2019-08-18 ENCOUNTER — Other Ambulatory Visit: Payer: Self-pay | Admitting: Family Medicine

## 2020-05-26 ENCOUNTER — Ambulatory Visit: Payer: 59 | Admitting: Allergy

## 2020-06-04 ENCOUNTER — Ambulatory Visit: Payer: 59 | Admitting: Allergy

## 2020-06-16 ENCOUNTER — Ambulatory Visit: Payer: 59 | Admitting: Allergy

## 2020-11-13 ENCOUNTER — Other Ambulatory Visit: Payer: Self-pay

## 2020-11-13 NOTE — Progress Notes (Signed)
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 

## 2021-01-08 DIAGNOSIS — M25562 Pain in left knee: Secondary | ICD-10-CM | POA: Insufficient documentation

## 2021-01-08 DIAGNOSIS — M25561 Pain in right knee: Secondary | ICD-10-CM | POA: Insufficient documentation

## 2021-01-19 ENCOUNTER — Other Ambulatory Visit: Payer: Self-pay

## 2021-01-19 ENCOUNTER — Encounter: Payer: Self-pay | Admitting: Physician Assistant

## 2021-01-19 ENCOUNTER — Ambulatory Visit: Payer: Self-pay | Admitting: Physician Assistant

## 2021-01-19 VITALS — BP 136/91 | HR 81 | Temp 98.2°F | Resp 14

## 2021-01-19 DIAGNOSIS — M545 Low back pain, unspecified: Secondary | ICD-10-CM

## 2021-01-19 DIAGNOSIS — M25562 Pain in left knee: Secondary | ICD-10-CM

## 2021-01-19 MED ORDER — NAPROXEN 500 MG PO TABS: 500.0000 mg | ORAL_TABLET | Freq: Two times a day (BID) | ORAL | Status: AC

## 2021-01-19 MED ORDER — ORPHENADRINE CITRATE ER 100 MG PO TB12: 100.0000 mg | ORAL_TABLET | Freq: Two times a day (BID) | ORAL | 0 refills | Status: DC

## 2021-01-19 NOTE — Progress Notes (Signed)
Larey Seat this morning coming into the building

## 2021-01-19 NOTE — Progress Notes (Signed)
   Subjective: Fall at work    Patient ID: Pamela Swanson, female    DOB: 1955/06/21, 65 y.o.   MRN: [id number]  HPI Patient slipped on a wet floor and fell backwards landing on her coccyx.  Patient attempted to stand up and then fell forward landing on bilateral knee.  Patient denies LOC or head injury.  Patient denies radicular component to back pain.  Patient denies bladder or bowel dysfunction.  Patient states coccyx pain increased with sitting.  Patient states bilateral knee pain increased with ambulation.  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  Patient rates her pain as a 7/10.  Described pain as "achy".  Patient is using a heating pad for her back pain.   Review of Systems Allergic rhinitis, DJD bilateral knee and GERD.    Objective:   Physical Exam  No acute distress.  Patient ambulating with atypical gait.  Patient sits and stands with assistance of the upper extremities.  No obvious spinal deformity.  Patient decreased range of motion with flexion and extension.  Patient is left paraspinal muscle spasm with lateral movements.  No obvious deformity to the bilateral knees moderate guarding with palpation.      Assessment & Plan: Muscle skeletal pain secondary to fall.   Further evaluation with x-ray of the lumbar/coccyx area and bilateral knees.  Patient given prescription naproxen and Norflex.  Patient placed on light duty return back in 4 days for reevaluation.

## 2021-01-26 ENCOUNTER — Other Ambulatory Visit: Payer: 59 | Admitting: Physician Assistant

## 2021-01-27 ENCOUNTER — Other Ambulatory Visit: Payer: 59 | Admitting: Physician Assistant

## 2021-01-27 ENCOUNTER — Encounter: Payer: Self-pay | Admitting: Physician Assistant

## 2021-01-27 ENCOUNTER — Ambulatory Visit: Payer: Self-pay | Admitting: Physician Assistant

## 2021-01-27 ENCOUNTER — Other Ambulatory Visit: Payer: Self-pay

## 2021-01-27 VITALS — BP 126/88 | HR 80 | Temp 97.3°F | Resp 14

## 2021-01-27 DIAGNOSIS — M25561 Pain in right knee: Secondary | ICD-10-CM

## 2021-01-27 DIAGNOSIS — M545 Low back pain, unspecified: Secondary | ICD-10-CM | POA: Insufficient documentation

## 2021-01-27 DIAGNOSIS — M25562 Pain in left knee: Secondary | ICD-10-CM

## 2021-01-27 MED ORDER — NAPROXEN 500 MG PO TABS: 500.0000 mg | ORAL_TABLET | Freq: Two times a day (BID) | ORAL | Status: AC

## 2021-01-27 MED ORDER — ORPHENADRINE CITRATE ER 100 MG PO TB12: 100.0000 mg | ORAL_TABLET | Freq: Two times a day (BID) | ORAL | 0 refills | Status: AC

## 2021-01-27 NOTE — Progress Notes (Signed)
   Subjective: Low back pain bilateral knee pain    Patient ID: Pamela Swanson, female    DOB: Sep 19, 1955, 65 y.o.   MRN: [id number]  HPI Patient is follow-up for low back pain and bilateral knee pain secondary to a slip and fall on 01/19/2021.  Since last visit patient has been seen by orthopedics at [facility].  Patient had x-rays of knees showing degenerative changes.  Patient also had x-ray of her back which led to an MRI to rule out a fracture.  MRI is consistent with degenerative changes of the lumbar spine.  Patient is continue care on [facility] and has follow appointment tomorrow.  Patient is requesting refill of the muscle relaxers and anti-inflammatory medication prescribed on her last visit to this clinic.  Patient continued denies any radicular component to her back pain.  Patient denies bladder or bowel dysfunction.  Patient is able to ambulate with only mild discomfort from her knee pain.   Review of Systems GERD    Objective:   Physical Exam No acute distress.  Patient sits and stands without any heavy reliance on upper extremities.  Patient has a normal gait. Temperature 97.3, pulse of 80, respiration 14, BP is 126/88, and patient 99% O2 sat on room air. No obvious deformity of the lumbar spine.  Patient has moderate guarding palpation along 3 through L5.  Patient decreased range of motion with flexion. No obvious deformity of the bilateral knee.  Mild crepitus with palpation.  No laxity with stress testing.       Assessment & Plan: Back pain and bilateral knee pain

## 2021-02-09 ENCOUNTER — Ambulatory Visit
Admission: RE | Admit: 2021-02-09 | Discharge: 2021-02-09 | Disposition: A | Discharge status: Home / Self Care | Payer: 59 | Source: Ambulatory Visit | Attending: Physician Assistant | Admitting: Physician Assistant

## 2021-02-09 ENCOUNTER — Ambulatory Visit: Payer: Self-pay | Admitting: Physician Assistant

## 2021-02-09 ENCOUNTER — Other Ambulatory Visit: Payer: Self-pay

## 2021-02-09 ENCOUNTER — Encounter: Payer: Self-pay | Admitting: Physician Assistant

## 2021-02-09 ENCOUNTER — Ambulatory Visit
Admission: RE | Admit: 2021-02-09 | Discharge: 2021-02-09 | Disposition: A | Discharge status: Home / Self Care | Payer: 59 | Attending: Physician Assistant | Admitting: Physician Assistant

## 2021-02-09 VITALS — BP 118/84 | HR 83 | Temp 97.5°F | Resp 14

## 2021-02-09 DIAGNOSIS — M79671 Pain in right foot: Secondary | ICD-10-CM

## 2021-02-09 NOTE — Progress Notes (Signed)
   Subjective: Right dorsal foot pain    Patient ID: Pamela Swanson, female    DOB: 11-06-1955, 65 y.o.   MRN: [id number]  HPI Patient complaining of right dorsal foot pain secondary to stepping in a hole 3 days ago.  Patient states she used ice and anti-inflammatory medication but still is experiencing pain to the dorsal aspect of the right foot.  Patient is able to bear weight.  Patient is wearing elastic foot support.   Review of Systems Osteopenia    Objective:   Physical Exam No acute distress.  No obvious deformity to the right foot.  Patient has full and equal l range of motion of the ankle/ foot joint.  Patient has moderate guarding palpation dorsal aspect of the second and third metatarsal.       Assessment & Plan: Right foot pain.   Will x-ray right foot to rule out fracture.

## 2021-02-09 NOTE — Progress Notes (Signed)
Twisted ankle Friday night - stepped in hole & twisted her ankle  Top of foot tingling Iced & Elevated Friday night & Saturday Wearing elastic support Has been taking Aleve Seems a little better Couldn't get on her regular shoes - wearing a boot  AMD

## 2024-02-01 ENCOUNTER — Telehealth: Payer: Self-pay

## 2024-02-01 NOTE — Telephone Encounter (Signed)
 Left a message for the patient to call the office patient has a referral in proficient for eval prior to RTKA  keloid .

## 2024-04-30 NOTE — Telephone Encounter (Signed)
 Left a message for the patient to call the office.
